# Patient Record
Sex: Male | Born: 1960 | Race: White | Hispanic: No | Marital: Married | Smoking: Never smoker
Health system: Southern US, Community
[De-identification: ages and names within clinical notes are randomized; demographics above are authoritative.]

## PROBLEM LIST (undated history)

## (undated) DIAGNOSIS — E785 Hyperlipidemia, unspecified: Secondary | ICD-10-CM

## (undated) HISTORY — PX: OTHER SURGICAL HISTORY: SHX169

## (undated) HISTORY — DX: Hyperlipidemia, unspecified: E78.5

## (undated) HISTORY — PX: MOUTH SURGERY: SHX715

---

## 2008-02-08 ENCOUNTER — Emergency Department (HOSPITAL_COMMUNITY): Admission: EM | Admit: 2008-02-08 | Discharge: 2008-02-08 | Payer: Self-pay | Admitting: Emergency Medicine

## 2010-06-16 ENCOUNTER — Ambulatory Visit (HOSPITAL_BASED_OUTPATIENT_CLINIC_OR_DEPARTMENT_OTHER): Admission: RE | Admit: 2010-06-16 | Discharge: 2010-06-16 | Payer: Self-pay | Admitting: Orthopedic Surgery

## 2011-08-04 ENCOUNTER — Inpatient Hospital Stay (INDEPENDENT_AMBULATORY_CARE_PROVIDER_SITE_OTHER)
Admission: RE | Admit: 2011-08-04 | Discharge: 2011-08-04 | Disposition: A | Discharge status: Home / Self Care | Payer: 59 | Source: Ambulatory Visit | Attending: Emergency Medicine | Admitting: Emergency Medicine

## 2011-08-04 DIAGNOSIS — K859 Acute pancreatitis without necrosis or infection, unspecified: Secondary | ICD-10-CM

## 2011-08-04 LAB — CBC
HCT: 44.6 % (ref 39.0–52.0)
Hemoglobin: 15.5 g/dL (ref 13.0–17.0)
MCHC: 34.8 g/dL (ref 30.0–36.0)
WBC: 9.2 10*3/uL (ref 4.0–10.5)

## 2011-08-04 LAB — POCT URINALYSIS DIP (DEVICE)
Glucose, UA: NEGATIVE mg/dL
Ketones, ur: 40 mg/dL — AB
Leukocytes, UA: NEGATIVE
Nitrite: NEGATIVE
pH: 5.5 (ref 5.0–8.0)

## 2011-08-04 LAB — COMPREHENSIVE METABOLIC PANEL
AST: 20 U/L (ref 0–37)
Albumin: 4.1 g/dL (ref 3.5–5.2)
Alkaline Phosphatase: 71 U/L (ref 39–117)
BUN: 9 mg/dL (ref 6–23)
CO2: 29 mEq/L (ref 19–32)
Chloride: 101 mEq/L (ref 96–112)
GFR calc non Af Amer: 88 mL/min — ABNORMAL LOW (ref >90)
Potassium: 3.9 mEq/L (ref 3.5–5.1)
Total Bilirubin: 0.5 mg/dL (ref 0.3–1.2)

## 2011-08-06 ENCOUNTER — Other Ambulatory Visit: Payer: Self-pay | Admitting: Family Medicine

## 2011-08-06 DIAGNOSIS — K859 Acute pancreatitis without necrosis or infection, unspecified: Secondary | ICD-10-CM

## 2011-08-07 ENCOUNTER — Ambulatory Visit
Admission: RE | Admit: 2011-08-07 | Discharge: 2011-08-07 | Disposition: A | Discharge status: Home / Self Care | Payer: 59 | Source: Ambulatory Visit | Attending: Family Medicine | Admitting: Family Medicine

## 2011-08-07 DIAGNOSIS — K859 Acute pancreatitis without necrosis or infection, unspecified: Secondary | ICD-10-CM

## 2011-08-15 ENCOUNTER — Encounter (INDEPENDENT_AMBULATORY_CARE_PROVIDER_SITE_OTHER): Payer: Self-pay | Admitting: General Surgery

## 2011-08-30 ENCOUNTER — Encounter (INDEPENDENT_AMBULATORY_CARE_PROVIDER_SITE_OTHER): Payer: Self-pay | Admitting: General Surgery

## 2011-08-30 ENCOUNTER — Ambulatory Visit (INDEPENDENT_AMBULATORY_CARE_PROVIDER_SITE_OTHER): Payer: 59 | Admitting: General Surgery

## 2011-08-30 VITALS — BP 120/74 | HR 68 | Temp 98.1°F | Resp 16 | Ht 74.0 in | Wt 189.6 lb

## 2011-08-30 DIAGNOSIS — K8020 Calculus of gallbladder without cholecystitis without obstruction: Secondary | ICD-10-CM

## 2011-08-30 NOTE — Patient Instructions (Signed)
You have gallstones and probably had an episode of pancreatitis which has now resolved. You will be scheduled for a laparoscopic cholecystectomy with cholangiogram in the near future.  Laparoscopic Cholecystectomy Laparoscopic cholecystectomy is surgery to remove the gallbladder. The gallbladder is located slightly to the right of center in the abdomen, behind the liver. It is a concentrating and storage sac for the bile produced in the liver. Bile aids in the digestion and absorption of fats. Gallbladder disease (cholecystitis) is an inflammation of your gallbladder. This condition is usually caused by a buildup of gallstones (cholelithiasis) in your gallbladder. Gallstones can block the flow of bile, resulting in inflammation and pain. In severe cases, emergency surgery may be required. When emergency surgery is not required, you will have time to prepare for the procedure. Laparoscopic surgery is an alternative to open surgery. Laparoscopic surgery usually has a shorter recovery time. Your common bile duct may also need to be examined and explored. Your caregiver will discuss this with you if he or she feels this should be done. If stones are found in the common bile duct, they may be removed. LET YOUR CAREGIVER KNOW ABOUT:  Allergies to food or medicine.   Medicines taken, including vitamins, herbs, eyedrops, over-the-counter medicines, and creams.   Use of steroids (by mouth or creams).   Previous problems with anesthetics or numbing medicines.   History of bleeding problems or blood clots.   Previous surgery.   Other health problems, including diabetes and kidney problems.   Possibility of pregnancy, if this applies.  RISKS AND COMPLICATIONS All surgery is associated with risks. Some problems that may occur following this procedure include:  Infection.   Damage to the common bile duct, nerves, arteries, veins, or other internal organs such as the stomach or intestines.   Bleeding.     A stone may remain in the common bile duct.  BEFORE THE PROCEDURE  Do not take aspirin for 3 days prior to surgery or blood thinners for 1 week prior to surgery.   Do not eat or drink anything after midnight the night before surgery.   Let your caregiver know if you develop a cold or other infectious problem prior to surgery.   You should be present 60 minutes before the procedure or as directed.  PROCEDURE  You will be given medicine that makes you sleep (general anesthetic). When you are asleep, your surgeon will make several small cuts (incisions) in your abdomen. One of these incisions is used to insert a small, lighted scope (laparoscope) into the abdomen. The laparoscope helps the surgeon see into your abdomen. Carbon dioxide gas will be pumped into your abdomen. The gas allows more room for the surgeon to perform your surgery. Other operating instruments are inserted through the other incisions. Laparoscopic procedures may not be appropriate when:  There is major scarring from previous surgery.   The gallbladder is extremely inflamed.   There are bleeding disorders or unexpected cirrhosis of the liver.   A pregnancy is near term.   Other conditions make the laparoscopic procedure impossible.  If your surgeon feels it is not safe to continue with a laparoscopic procedure, he or she will perform an open abdominal procedure. In this case, the surgeon will make an incision to open the abdomen. This gives the surgeon a larger view and field to work within. This may allow the surgeon to perform procedures that sometimes cannot be performed with a laparoscope alone. Open surgery has a longer recovery time.  AFTER THE PROCEDURE  You will be taken to the recovery area where a nurse will watch and check your progress.   You may be allowed to go home the same day.   Do not resume physical activities until directed by your caregiver.   You may resume a normal diet and activities as  directed.  Document Released: 09/17/2005 Document Revised: 05/30/2011 Document Reviewed: 03/02/2011 [facility] Patient Information 2012 [place], [place].

## 2011-08-30 NOTE — Progress Notes (Signed)
Patient ID: Curtis Jensen, male   DOB: 1960-11-22, 50 y.o.   MRN: [id number]  Chief Complaint  Patient presents with  . Abdominal Pain    gallstones    HPI Curtis Jensen is a 50 y.o. male.  He referred by Dr. Carman Ching for evaluation of gallstones. His primary physician Dr. Manus Gunning.  Approximally one month ago he developed upper abdominal pain. He describes it as bandlike. He became anorexic but did not vomit. After 2 or 3 days the pain became more severe and radiated into his back and he went [facility]. Lab work revealed a normal CBC, normal complete metabolic panel, but his lipase was elevated to 148. He was discharged home on anti-emetics and analgesics and the pain resolved over the next 3 days. He has had no recurrence. No prior episodes. Outpatient ultrasound was performed which shows multiple small gallstones. There is no free fluid. Common bile duct normal. Pancreas and liver are normal.  He is here today to discuss cholecystectomy. HPI  Past Medical History  Diagnosis Date  . Hyperlipidemia     Past Surgical History  Procedure Date  . Right finger surgery     Family History  Problem Relation Age of Onset  . Asthma Mother   . Cancer Father     Social History History  Substance Use Topics  . Smoking status: Never Smoker   . Smokeless tobacco: Not on file  . Alcohol Use: Yes    No Known Allergies  Current Outpatient Prescriptions  Medication Sig Dispense Refill  . simvastatin (ZOCOR) 20 MG tablet Take 20 mg by mouth at bedtime.          Review of Systems Review of Systems  Constitutional: Negative for fever, chills and unexpected weight change.  HENT: Negative for hearing loss, congestion, sore throat, trouble swallowing and voice change.   Eyes: Negative for visual disturbance.  Respiratory: Negative for cough and wheezing.   Cardiovascular: Negative for chest pain, palpitations and leg swelling.  Gastrointestinal: Positive for  nausea and abdominal pain. Negative for vomiting, diarrhea, constipation, blood in stool, abdominal distention, anal bleeding and rectal pain.  Genitourinary: Negative for hematuria and difficulty urinating.  Musculoskeletal: Negative for arthralgias.  Skin: Negative for rash and wound.  Neurological: Negative for seizures, syncope, weakness and headaches.  Hematological: Negative for adenopathy. Does not bruise/bleed easily.  Psychiatric/Behavioral: Negative for confusion.    Blood pressure 120/74, pulse 68, temperature 98.1 F (36.7 C), temperature source Temporal, resp. rate 16, height 6\' 2"  (1.88 m), weight 189 lb 9.6 oz (86.002 kg).  Physical Exam Physical Exam  Constitutional: He is oriented to person, place, and time. He appears well-developed and well-nourished. No distress.  HENT:  Head: Normocephalic.  Nose: Nose normal.  Mouth/Throat: No oropharyngeal exudate.  Eyes: Conjunctivae and EOM are normal. Pupils are equal, round, and reactive to light. Right eye exhibits no discharge. Left eye exhibits no discharge. No scleral icterus.  Neck: Normal range of motion. Neck supple. No JVD present. No tracheal deviation present. No thyromegaly present.  Cardiovascular: Normal rate, regular rhythm, normal heart sounds and intact distal pulses.   No murmur heard. Pulmonary/Chest: Effort normal and breath sounds normal. No stridor. No respiratory distress. He has no wheezes. He has no rales. He exhibits no tenderness.  Abdominal: Soft. Bowel sounds are normal. He exhibits no distension and no mass. There is no tenderness. There is no rebound and no guarding.  Musculoskeletal: Normal range of  motion. He exhibits no edema and no tenderness.  Lymphadenopathy:    He has no cervical adenopathy.  Neurological: He is alert and oriented to person, place, and time. He has normal reflexes. Coordination normal.  Skin: Skin is warm and dry. No rash noted. He is not diaphoretic. No erythema. No pallor.   Psychiatric: He has a normal mood and affect. His behavior is normal. Judgment and thought content normal.    Data Reviewed I have reviewed the ultrasound report, the lab work, and Dr. Randa Evens office notes.  Assessment    Chronic cholecystitis with cholelithiasis.  Recent episode of pancreatitis which presumably was mild and self-limited.  Hyperlipidemia    Plan    We'll schedule for elective laparoscopic cholecystectomy with cholangiogram.  I've discussed the indications and details of the surgery with him. Risks and complications have been outlined, including but not not limited to bleeding, infection, conversion to open laparotomy, bile leak, injury to adjacent organs with major reconstructive surgery, wound healing problems, cardiac pulmonary and thromboembolic problems. He seems to understand these issues well. At this time all of his questions were answered. He is in agreement with this plan.        Lalah Durango M 08/30/2011, 10:43 AM

## 2011-09-11 DIAGNOSIS — K8010 Calculus of gallbladder with chronic cholecystitis without obstruction: Secondary | ICD-10-CM

## 2011-09-11 HISTORY — PX: CHOLECYSTECTOMY: SHX55

## 2011-10-08 ENCOUNTER — Ambulatory Visit (INDEPENDENT_AMBULATORY_CARE_PROVIDER_SITE_OTHER): Payer: 59 | Admitting: General Surgery

## 2011-10-08 ENCOUNTER — Encounter (INDEPENDENT_AMBULATORY_CARE_PROVIDER_SITE_OTHER): Payer: Self-pay | Admitting: General Surgery

## 2011-10-08 VITALS — BP 126/88 | HR 68 | Temp 97.2°F | Resp 12 | Ht 74.0 in | Wt 193.4 lb

## 2011-10-08 DIAGNOSIS — Z9889 Other specified postprocedural states: Secondary | ICD-10-CM

## 2011-10-08 NOTE — Progress Notes (Signed)
Subjective:     Patient ID: Curtis Jensen, male   DOB: May 15, 1961, 51 y.o.   MRN: [id number]  HPI This healthy 51 year old man underwent laparoscopic cholecystectomy with cholangiogram on September 11, 2011. The cholangiogram was normal. The final pathology report shows chronic cholecystitis with cholelithiasis. He has done well. He is back  to a normal diet and normal activities without any complaints.  Review of Systems     Objective:   Physical Exam The patient looks well. He is in no distress.  Abdomen is soft flat nontender. All the trocar sites are well healed.   Assessment:     Chronic cholecystitis with cholelithiasis, uneventful recovery following laparoscopic cholecystectomy with cholangiogram    Plan:     He was given a copy of his pathology report.  Low fat diet advised.  Resume normal activities without restriction  Return to see me p.r.n.     Angelia Mould. Derrell Lolling, M.D., [facility] General and Minimally invasive Surgery Breast and Colorectal Surgery Office:   [phone] Pager:   [phone]

## 2011-10-08 NOTE — Patient Instructions (Signed)
You have recovered without problems following your cholecystectomy. All of your wounds have healed normally. You may resume all normal physical activities, including sports, without restriction. Return to see me if any new problems arise.

## 2016-08-25 ENCOUNTER — Ambulatory Visit (HOSPITAL_COMMUNITY)
Admission: EM | Admit: 2016-08-25 | Discharge: 2016-08-25 | Disposition: A | Discharge status: Home / Self Care | Payer: Commercial Managed Care - HMO | Attending: Emergency Medicine | Admitting: Emergency Medicine

## 2016-08-25 ENCOUNTER — Encounter (HOSPITAL_COMMUNITY): Payer: Self-pay | Admitting: Emergency Medicine

## 2016-08-25 DIAGNOSIS — S61216A Laceration without foreign body of right little finger without damage to nail, initial encounter: Secondary | ICD-10-CM

## 2016-08-25 MED ORDER — LIDOCAINE HCL 2 % IJ SOLN
INTRAMUSCULAR | Status: AC
  Filled 2016-08-25: qty 20

## 2016-08-25 NOTE — ED Triage Notes (Signed)
Pt report lac between the 4th and  5th digit on right hand/web onset 1600  Reports he was unloading dishwasher and cut his finger w/a glass  Bleeding controlled.   Last tetanus w/in 3 years  A&O x4... NAD

## 2016-08-25 NOTE — ED Provider Notes (Signed)
[facility]    CSN: [id number] Arrival date & time: 08/25/16  1636     History   Chief Complaint Chief Complaint  Patient presents with  . Extremity Laceration    HPI Curtis Jensen is a 55 y.o. male.   HPI  He is a 55 year old man here for evaluation of right hand laceration. He was cleaning a glass that had chipped, top and it cut his right little finger. No foreign body. He thinks his last tetanus was 3 years ago. He has full range of motion of his finger.  Past Medical History:  Diagnosis Date  . Hyperlipidemia     There are no active problems to display for this patient.   Past Surgical History:  Procedure Laterality Date  . CHOLECYSTECTOMY  09/11/11  . MOUTH SURGERY    . right finger surgery         Home Medications    Prior to Admission medications   Medication Sig Start Date End Date Taking? Authorizing Provider  simvastatin (ZOCOR) 20 MG tablet Take 20 mg by mouth at bedtime.     Yes Historical Provider, MD    Family History Family History  Problem Relation Age of Onset  . Asthma Mother   . Cancer Father     lung    Social History Social History  Substance Use Topics  . Smoking status: Never Smoker  . Smokeless tobacco: Never Used  . Alcohol use Yes     Allergies   Salmon [fish allergy]   Review of Systems Review of Systems As in history of present illness  Physical Exam Triage Vital Signs ED Triage Vitals  Enc Vitals Group     BP 08/25/16 1705 122/78     Pulse Rate 08/25/16 1705 74     Resp 08/25/16 1705 18     Temp 08/25/16 1705 98.5 F (36.9 C)     Temp Source 08/25/16 1705 Oral     SpO2 08/25/16 1705 100 %     Weight --      Height --      Head Circumference --      Peak Flow --      Pain Score 08/25/16 1706 2     Pain Loc --      Pain Edu? --      Excl. in [place]? --    No data found.   Updated Vital Signs BP 122/78 (BP Location: Left Arm)   Pulse 74   Temp 98.5 F (36.9 C) (Oral)   Resp 18    SpO2 100%   Visual Acuity Right Eye Distance:   Left Eye Distance:   Bilateral Distance:    Right Eye Near:   Left Eye Near:    Bilateral Near:     Physical Exam  Constitutional: He is oriented to person, place, and time. He appears well-developed and well-nourished. No distress.  Cardiovascular: Normal rate.   Pulmonary/Chest: Effort normal.  Neurological: He is alert and oriented to person, place, and time.  Skin:  1.5 cm laceration to the proximal little finger, almost in the web space. Full active range of motion.     UC Treatments / Results  Labs (all labs ordered are listed, but only abnormal results are displayed) Labs Reviewed - No data to display  EKG  EKG Interpretation None       Radiology No results found.  Procedures .Marland KitchenLaceration Repair Date/Time: 08/25/2016 6:22 PM Performed by: Melony Overly Authorized by:  HONIG, ERIN J   Consent:    Consent obtained:  Verbal   Consent given by:  Patient   Alternatives discussed:  No treatment Anesthesia (see MAR for exact dosages):    Anesthesia method:  Local infiltration   Local anesthetic:  Lidocaine 2% w/o epi Laceration details:    Location:  Finger   Finger location:  R small finger   Length (cm):  1.5 Repair type:    Repair type:  Simple Exploration:    Hemostasis achieved with:  Direct pressure   Wound exploration: wound explored through full range of motion     Wound extent: no tendon damage noted     Contaminated: no   Treatment:    Area cleansed with:  Betadine   Amount of cleaning:  Standard Skin repair:    Repair method:  Sutures   Suture size:  5-0   Suture material:  Prolene   Number of sutures:  2 Approximation:    Approximation:  Close Post-procedure details:    Dressing:  Antibiotic ointment and sterile dressing   Patient tolerance of procedure:  Tolerated well, no immediate complications   (including critical care time)  Medications Ordered in UC Medications - No data to  display   Initial Impression / Assessment and Plan / UC Course  I have reviewed the triage vital signs and the nursing notes.  Pertinent labs & imaging results that were available during my care of the patient were reviewed by me and considered in my medical decision making (see chart for details).  Clinical Course     2 sutures placed. Wound care instructions given. Follow-up as needed.  Final Clinical Impressions(s) / UC Diagnoses   Final diagnoses:  Laceration of right little finger without foreign body without damage to nail, initial encounter    New Prescriptions New Prescriptions   No medications on file     Melony Overly, MD 08/25/16 [phone]

## 2016-08-25 NOTE — Discharge Instructions (Signed)
Keep it dry for the next 24 hours. After that you can let warm soapy water run over it twice a day. Do not soak it in water. Come back in one week for suture removal. Call your doctor to make sure you had a tetanus in the last 5 years.

## 2016-10-30 DIAGNOSIS — B349 Viral infection, unspecified: Secondary | ICD-10-CM | POA: Diagnosis not present

## 2016-12-04 DIAGNOSIS — B351 Tinea unguium: Secondary | ICD-10-CM | POA: Diagnosis not present

## 2016-12-04 DIAGNOSIS — L814 Other melanin hyperpigmentation: Secondary | ICD-10-CM | POA: Diagnosis not present

## 2016-12-04 DIAGNOSIS — L821 Other seborrheic keratosis: Secondary | ICD-10-CM | POA: Diagnosis not present

## 2016-12-04 DIAGNOSIS — D1801 Hemangioma of skin and subcutaneous tissue: Secondary | ICD-10-CM | POA: Diagnosis not present

## 2016-12-11 DIAGNOSIS — E7800 Pure hypercholesterolemia, unspecified: Secondary | ICD-10-CM | POA: Diagnosis not present

## 2017-01-11 DIAGNOSIS — E7800 Pure hypercholesterolemia, unspecified: Secondary | ICD-10-CM | POA: Diagnosis not present

## 2017-01-11 DIAGNOSIS — Z79899 Other long term (current) drug therapy: Secondary | ICD-10-CM | POA: Diagnosis not present

## 2017-02-05 DIAGNOSIS — Z79899 Other long term (current) drug therapy: Secondary | ICD-10-CM | POA: Diagnosis not present

## 2017-02-05 DIAGNOSIS — R748 Abnormal levels of other serum enzymes: Secondary | ICD-10-CM | POA: Diagnosis not present

## 2017-02-05 DIAGNOSIS — E7800 Pure hypercholesterolemia, unspecified: Secondary | ICD-10-CM | POA: Diagnosis not present

## 2017-04-16 DIAGNOSIS — E782 Mixed hyperlipidemia: Secondary | ICD-10-CM | POA: Diagnosis not present

## 2017-04-16 DIAGNOSIS — Z79899 Other long term (current) drug therapy: Secondary | ICD-10-CM | POA: Diagnosis not present

## 2017-04-24 ENCOUNTER — Other Ambulatory Visit: Payer: Self-pay | Admitting: Family Medicine

## 2017-04-24 DIAGNOSIS — R748 Abnormal levels of other serum enzymes: Secondary | ICD-10-CM

## 2017-04-30 ENCOUNTER — Ambulatory Visit
Admission: RE | Admit: 2017-04-30 | Discharge: 2017-04-30 | Disposition: A | Discharge status: Home / Self Care | Payer: Commercial Managed Care - HMO | Source: Ambulatory Visit | Attending: Family Medicine | Admitting: Family Medicine

## 2017-04-30 DIAGNOSIS — R748 Abnormal levels of other serum enzymes: Secondary | ICD-10-CM

## 2017-04-30 DIAGNOSIS — R945 Abnormal results of liver function studies: Secondary | ICD-10-CM | POA: Diagnosis not present

## 2017-08-27 DIAGNOSIS — E7800 Pure hypercholesterolemia, unspecified: Secondary | ICD-10-CM | POA: Diagnosis not present

## 2017-12-23 DIAGNOSIS — E782 Mixed hyperlipidemia: Secondary | ICD-10-CM | POA: Diagnosis not present

## 2017-12-23 DIAGNOSIS — Z79899 Other long term (current) drug therapy: Secondary | ICD-10-CM | POA: Diagnosis not present

## 2017-12-23 DIAGNOSIS — G43109 Migraine with aura, not intractable, without status migrainosus: Secondary | ICD-10-CM | POA: Diagnosis not present

## 2018-03-31 DIAGNOSIS — M62838 Other muscle spasm: Secondary | ICD-10-CM | POA: Diagnosis not present

## 2018-03-31 DIAGNOSIS — M7660 Achilles tendinitis, unspecified leg: Secondary | ICD-10-CM | POA: Diagnosis not present

## 2018-04-22 DIAGNOSIS — M542 Cervicalgia: Secondary | ICD-10-CM | POA: Diagnosis not present

## 2018-04-22 DIAGNOSIS — M7662 Achilles tendinitis, left leg: Secondary | ICD-10-CM | POA: Diagnosis not present

## 2018-04-22 DIAGNOSIS — M4722 Other spondylosis with radiculopathy, cervical region: Secondary | ICD-10-CM | POA: Diagnosis not present

## 2018-04-24 DIAGNOSIS — M7662 Achilles tendinitis, left leg: Secondary | ICD-10-CM | POA: Diagnosis not present

## 2018-04-30 DIAGNOSIS — M7662 Achilles tendinitis, left leg: Secondary | ICD-10-CM | POA: Diagnosis not present

## 2018-05-06 DIAGNOSIS — M7662 Achilles tendinitis, left leg: Secondary | ICD-10-CM | POA: Diagnosis not present

## 2018-05-08 DIAGNOSIS — M7662 Achilles tendinitis, left leg: Secondary | ICD-10-CM | POA: Diagnosis not present

## 2018-05-09 DIAGNOSIS — M7662 Achilles tendinitis, left leg: Secondary | ICD-10-CM | POA: Diagnosis not present

## 2018-07-06 ENCOUNTER — Ambulatory Visit (HOSPITAL_COMMUNITY)
Admission: EM | Admit: 2018-07-06 | Discharge: 2018-07-06 | Disposition: A | Discharge status: Home / Self Care | Payer: 59 | Attending: Internal Medicine | Admitting: Internal Medicine

## 2018-07-06 ENCOUNTER — Encounter (HOSPITAL_COMMUNITY): Payer: Self-pay | Admitting: Emergency Medicine

## 2018-07-06 DIAGNOSIS — H66003 Acute suppurative otitis media without spontaneous rupture of ear drum, bilateral: Secondary | ICD-10-CM

## 2018-07-06 MED ORDER — FLUTICASONE PROPIONATE 50 MCG/ACT NA SUSP: 1.0000 | Freq: Every day | NASAL | 0 refills | Status: AC

## 2018-07-06 MED ORDER — CETIRIZINE HCL 10 MG PO CAPS: 10.0000 mg | ORAL_CAPSULE | Freq: Every day | ORAL | 0 refills | Status: AC

## 2018-07-06 MED ORDER — AMOXICILLIN-POT CLAVULANATE 875-125 MG PO TABS: 1.0000 | ORAL_TABLET | Freq: Two times a day (BID) | ORAL | 0 refills | Status: AC

## 2018-07-06 NOTE — ED Triage Notes (Signed)
Pt sts URI sx x 5 days with some ear pain

## 2018-07-06 NOTE — ED Provider Notes (Signed)
[facility]    CSN: [id number] Arrival date & time: 07/06/18  1015     History   Chief Complaint Chief Complaint  Patient presents with  . URI    HPI Curtis Jensen is a 57 y.o. male history of hyperlipidemia presenting today for evaluation of URI symptoms and ear pain.  Patient symptoms began approximately 5 days ago.  Symptoms began with a sore throat, developed into a head cold with significant congestion, occasional coughing.  The symptoms have slightly improved, but he has developed worsening ear pain and feeling as if his ears are clogged.  Has had subjective fevers.  Has not tried any over-the-counter medicines.  Denies history of asthma or smoking.  Denies shortness of breath and chest pain.  HPI  Past Medical History:  Diagnosis Date  . Hyperlipidemia     There are no active problems to display for this patient.   Past Surgical History:  Procedure Laterality Date  . CHOLECYSTECTOMY  09/11/11  . MOUTH SURGERY    . right finger surgery         Home Medications    Prior to Admission medications   Medication Sig Start Date End Date Taking? Authorizing Provider  amoxicillin-clavulanate (AUGMENTIN) 875-125 MG tablet Take 1 tablet by mouth every 12 (twelve) hours for 10 days. 07/06/18 07/16/18  Kayloni Rocco C, PA-C  Cetirizine HCl 10 MG CAPS Take 1 capsule (10 mg total) by mouth daily for 15 days. 07/06/18 07/21/18  Nadean Montanaro C, PA-C  fluticasone (FLONASE) 50 MCG/ACT nasal spray Place 1-2 sprays into both nostrils daily for 7 days. 07/06/18 07/13/18  Rogerick Baldwin C, PA-C  simvastatin (ZOCOR) 20 MG tablet Take 20 mg by mouth at bedtime.      [provider]    Family History Family History  Problem Relation Age of Onset  . Asthma Mother   . Cancer Father        lung    Social History Social History   Tobacco Use  . Smoking status: Never Smoker  . Smokeless tobacco: Never Used  Substance Use Topics  . Alcohol use: Yes  .  Drug use: No     Allergies   Salmon [fish allergy]   Review of Systems Review of Systems  Constitutional: Negative for activity change, appetite change, chills, fatigue and fever.  HENT: Positive for congestion, ear pain, rhinorrhea and sore throat. Negative for sinus pressure and trouble swallowing.   Eyes: Negative for discharge and redness.  Respiratory: Positive for cough. Negative for chest tightness and shortness of breath.   Cardiovascular: Negative for chest pain.  Gastrointestinal: Negative for abdominal pain, diarrhea, nausea and vomiting.  Musculoskeletal: Negative for myalgias.  Skin: Negative for rash.  Neurological: Negative for dizziness, light-headedness and headaches.     Physical Exam Triage Vital Signs ED Triage Vitals  Enc Vitals Group     BP 07/06/18 1048 (!) 153/82     Pulse Rate 07/06/18 1048 74     Resp 07/06/18 1048 16     Temp 07/06/18 1048 98.8 F (37.1 C)     Temp Source 07/06/18 1048 Oral     SpO2 07/06/18 1048 100 %     Weight --      Height --      Head Circumference --      Peak Flow --      Pain Score 07/06/18 1051 4     Pain Loc --      Pain  Edu? --      Excl. in [place]? --    No data found.  Updated Vital Signs BP (!) 153/82 (BP Location: Right Arm)   Pulse 74   Temp 98.8 F (37.1 C) (Oral)   Resp 16   SpO2 100%   Visual Acuity Right Eye Distance:   Left Eye Distance:   Bilateral Distance:    Right Eye Near:   Left Eye Near:    Bilateral Near:     Physical Exam  Constitutional: He appears well-developed and well-nourished.  HENT:  Head: Normocephalic and atraumatic.  Bilateral TMs erythematous, dull and bulging, EACs clear.  Oral mucosa pink and moist, no tonsillar enlargement or exudate. Posterior pharynx patent and nonerythematous, no uvula deviation or swelling. Normal phonation.  Eyes: Conjunctivae are normal.  Neck: Neck supple.  Cardiovascular: Normal rate and regular rhythm.  No murmur  heard. Pulmonary/Chest: Effort normal and breath sounds normal. No respiratory distress.  Breathing comfortably at rest, CTABL, no wheezing, rales or other adventitious sounds auscultated  Abdominal: Soft. There is no tenderness.  Musculoskeletal: He exhibits no edema.  Neurological: He is alert.  Skin: Skin is warm and dry.  Psychiatric: He has a normal mood and affect.  Nursing note and vitals reviewed.    UC Treatments / Results  Labs (all labs ordered are listed, but only abnormal results are displayed) Labs Reviewed - No data to display  EKG None  Radiology No results found.  Procedures Procedures (including critical care time)  Medications Ordered in UC Medications - No data to display  Initial Impression / Assessment and Plan / UC Course  I have reviewed the triage vital signs and the nursing notes.  Pertinent labs & imaging results that were available during my care of the patient were reviewed by me and considered in my medical decision making (see chart for details).     Bilateral otitis media, will treat with Augmentin.  Also discussed starting daily allergy pill and Flonase for congestion and drainage.Discussed strict return precautions. Patient verbalized understanding and is agreeable with plan.  Final Clinical Impressions(s) / UC Diagnoses   Final diagnoses:  Non-recurrent acute suppurative otitis media of both ears without spontaneous rupture of tympanic membranes     Discharge Instructions     Both ears are infected Please begin augmentin twice daily for 10 days Daily allergy pill- zyrtec, claritin, store brand okay Flonase nasal spray 1-2 sprays in each nostril daily  Follow up if symptoms not improving or worsening, developing fever, difficulty breathing   ED Prescriptions    Medication Sig Dispense Auth. Provider   amoxicillin-clavulanate (AUGMENTIN) 875-125 MG tablet Take 1 tablet by mouth every 12 (twelve) hours for 10 days. 20 tablet  Neale Marzette C, PA-C   Cetirizine HCl 10 MG CAPS Take 1 capsule (10 mg total) by mouth daily for 15 days. 15 capsule Bridgitt Raggio C, PA-C   fluticasone (FLONASE) 50 MCG/ACT nasal spray Place 1-2 sprays into both nostrils daily for 7 days. 1 g Tara Rud, [place] C, PA-C     Controlled Substance Prescriptions [place] Controlled Substance Registry consulted? Not Applicable   Janith Lima, [place] 07/06/18 1121

## 2018-07-06 NOTE — Discharge Instructions (Signed)
Both ears are infected Please begin augmentin twice daily for 10 days Daily allergy pill- zyrtec, claritin, store brand okay Flonase nasal spray 1-2 sprays in each nostril daily  Follow up if symptoms not improving or worsening, developing fever, difficulty breathing

## 2018-07-14 DIAGNOSIS — H6983 Other specified disorders of Eustachian tube, bilateral: Secondary | ICD-10-CM | POA: Diagnosis not present

## 2018-09-12 IMAGING — US US ABDOMEN LIMITED
1 series · 14 of 25 positions shown · non-contrast
Comparison: August 07, 2011

CLINICAL DATA: Elevated liver enzymes

EXAM:
ULTRASOUND ABDOMEN LIMITED RIGHT UPPER QUADRANT

[Series 1: us abdomen limited · 0.22mm/px · 14 of 36 slices shown]
[im 1/36]
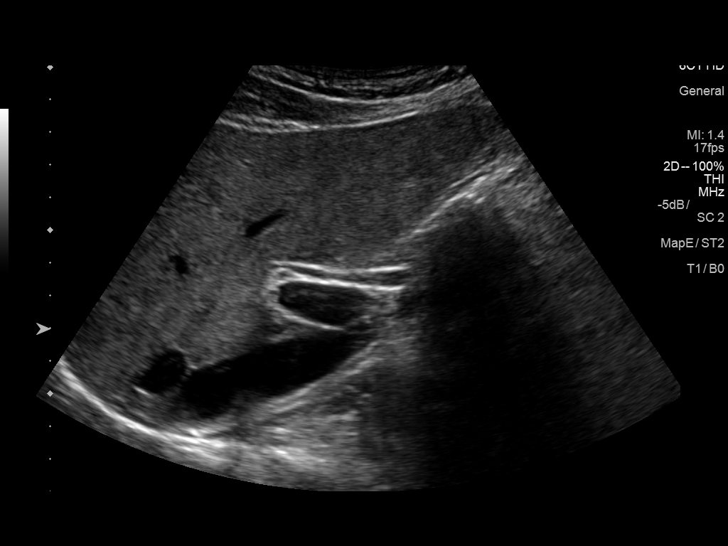
[im 3/36]
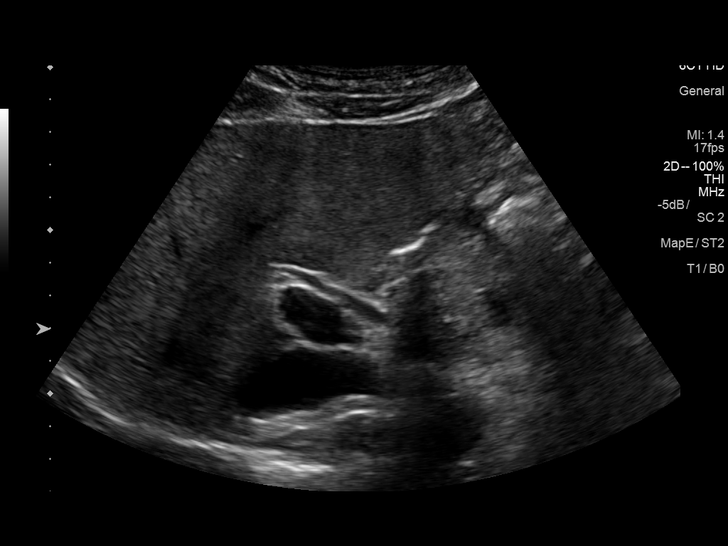
[im 6/36]
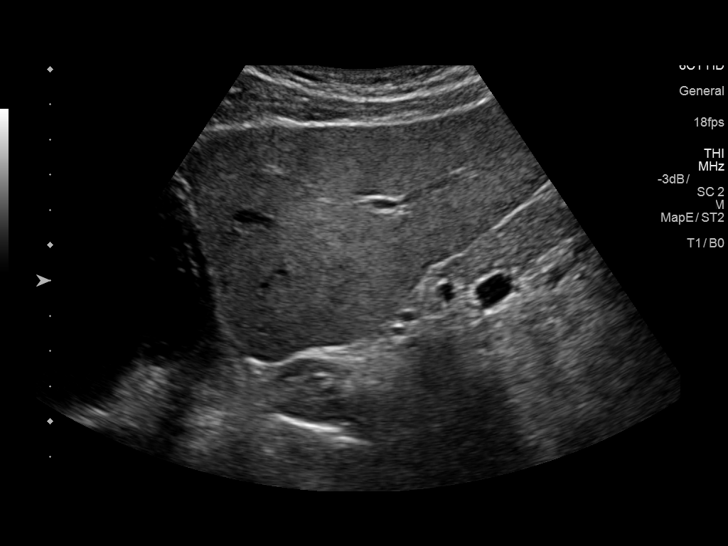
[im 9/36]
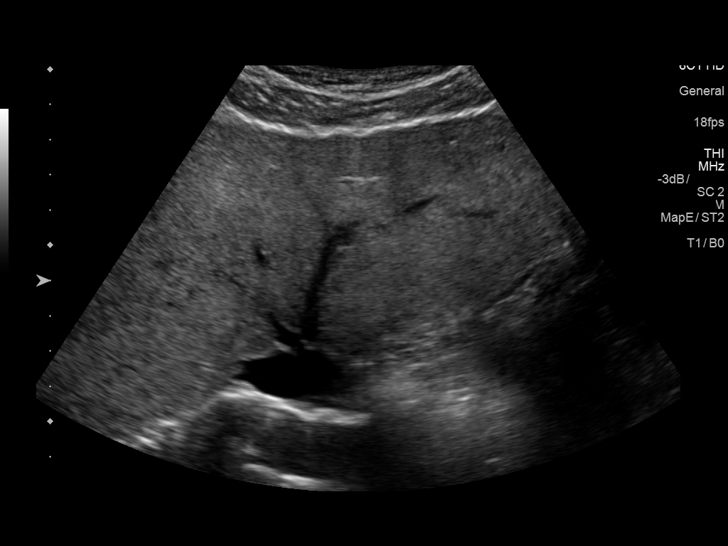
[im 12/36]
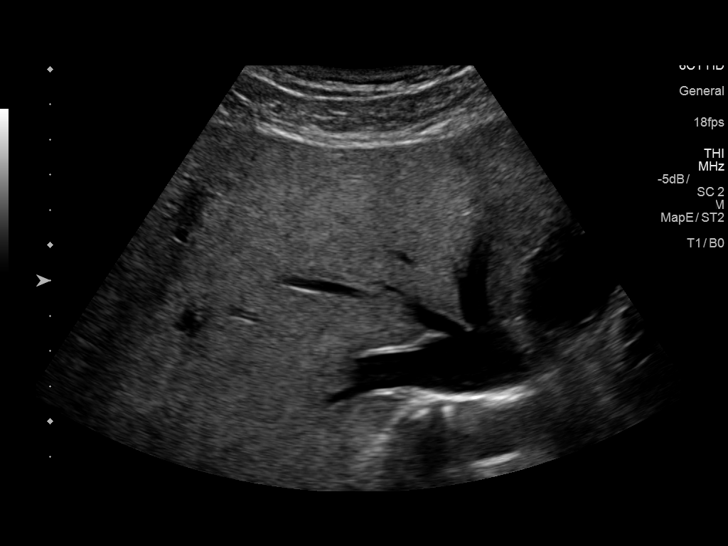
[im 14/36]
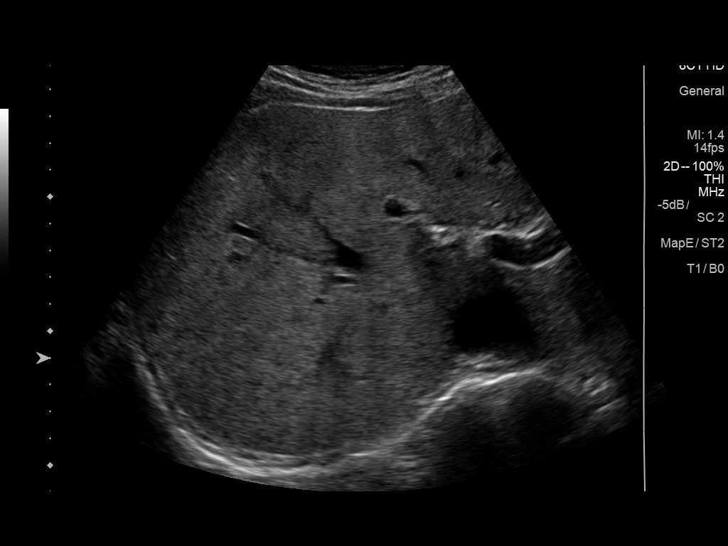
[im 17/36]
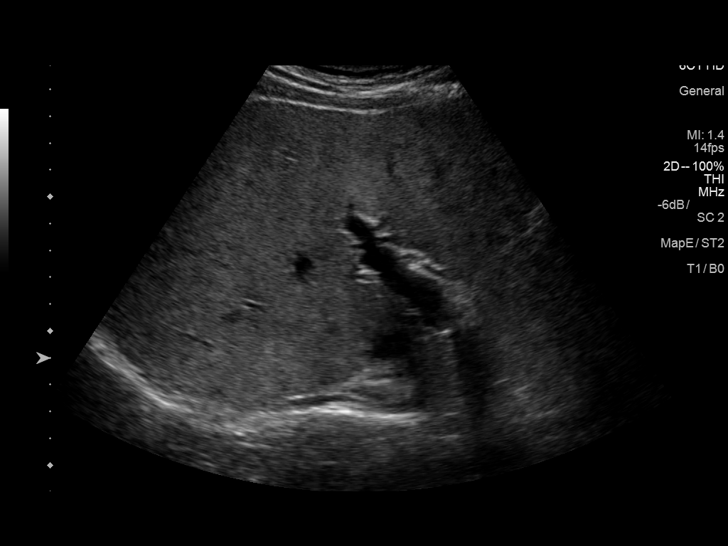
[im 19/36]
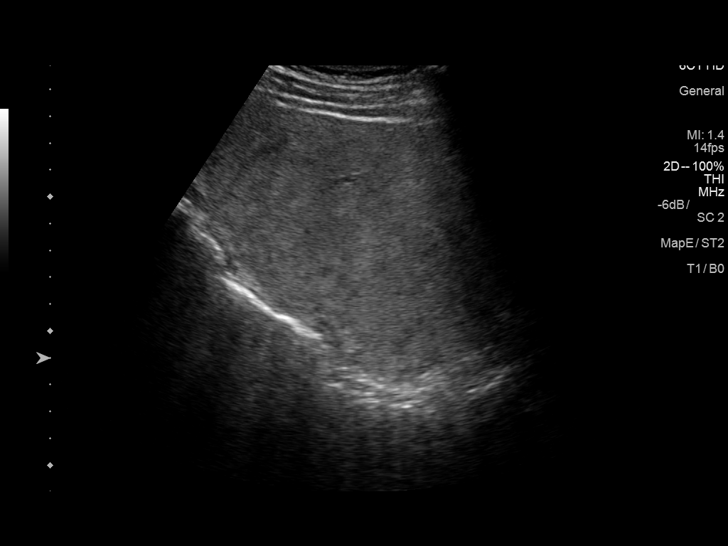
[im 22/36]
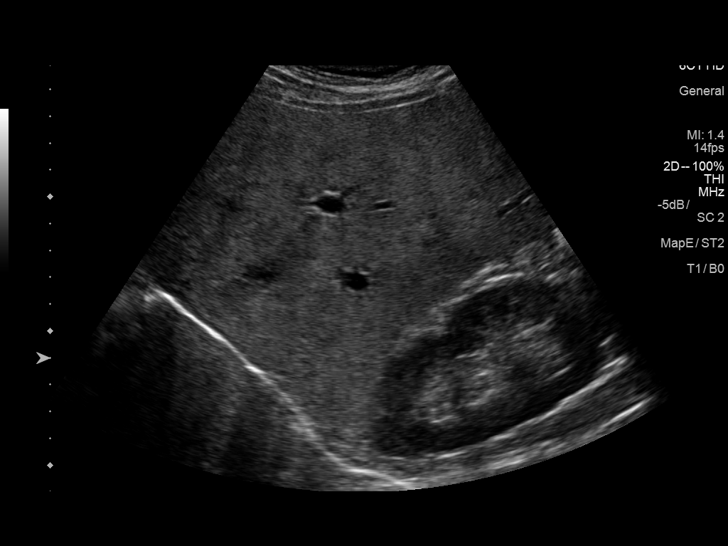
[im 24/36]
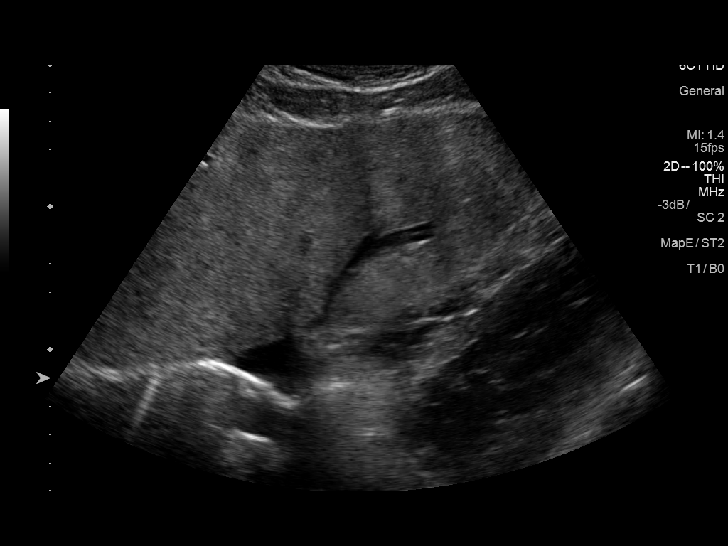
[im 27/36]
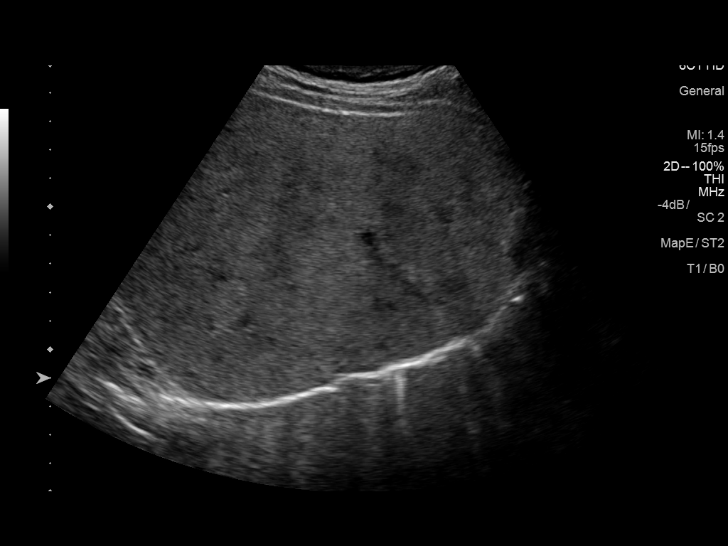
[im 30/36]
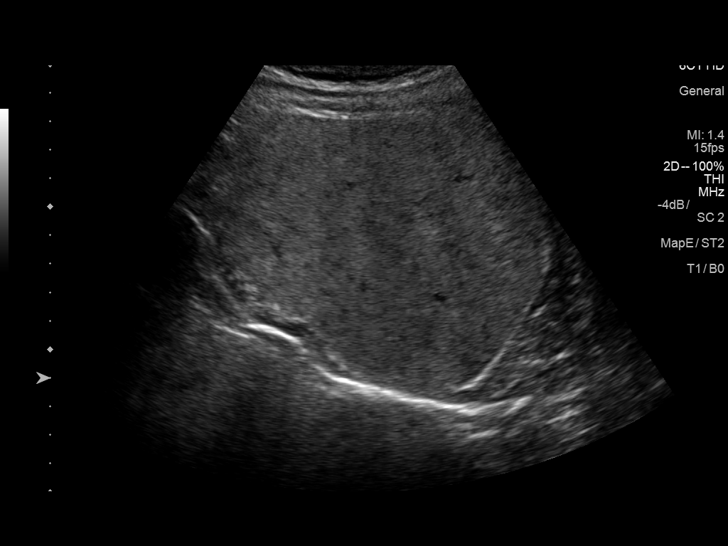
[im 33/36]
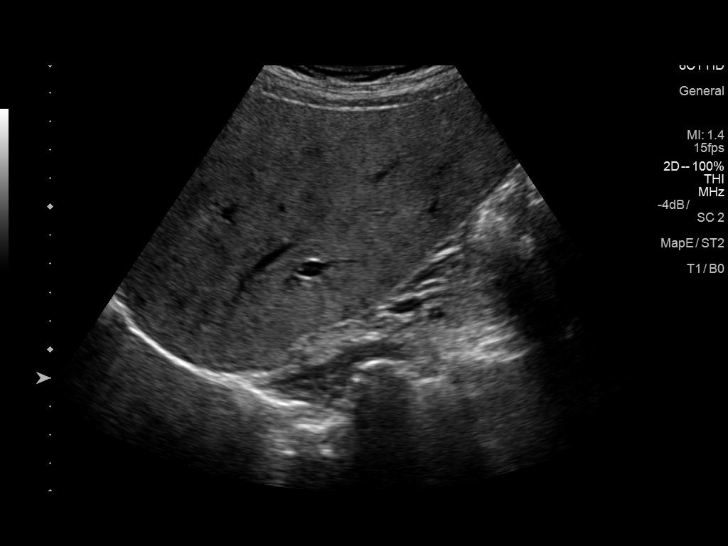
[im 36/36]
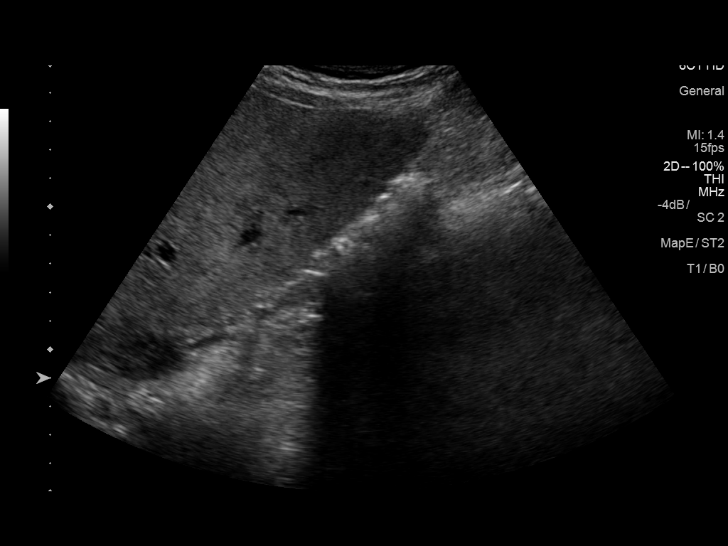

[14 of 25 positions shown; findings below may reference images not displayed]

FINDINGS: Gallbladder:

Surgically absent.

Common bile duct:

Diameter: 4 mm. No intrahepatic or extrahepatic biliary duct
dilatation.

Liver:

No focal lesion identified. Within normal limits in parenchymal
echogenicity. Flow in the portal vein is in the anatomic direction.
IMPRESSION: Gallbladder absent.  Study otherwise unremarkable.

## 2018-10-26 DIAGNOSIS — Z23 Encounter for immunization: Secondary | ICD-10-CM | POA: Diagnosis not present

## 2019-01-07 DIAGNOSIS — G43109 Migraine with aura, not intractable, without status migrainosus: Secondary | ICD-10-CM | POA: Diagnosis not present

## 2019-01-07 DIAGNOSIS — Z79899 Other long term (current) drug therapy: Secondary | ICD-10-CM | POA: Diagnosis not present

## 2019-01-07 DIAGNOSIS — E782 Mixed hyperlipidemia: Secondary | ICD-10-CM | POA: Diagnosis not present

## 2019-02-19 DIAGNOSIS — E782 Mixed hyperlipidemia: Secondary | ICD-10-CM | POA: Diagnosis not present

## 2019-02-19 DIAGNOSIS — Z79899 Other long term (current) drug therapy: Secondary | ICD-10-CM | POA: Diagnosis not present

## 2019-07-27 ENCOUNTER — Other Ambulatory Visit: Payer: Self-pay | Admitting: *Deleted

## 2019-07-27 DIAGNOSIS — Z20822 Contact with and (suspected) exposure to covid-19: Secondary | ICD-10-CM

## 2019-07-28 LAB — NOVEL CORONAVIRUS, NAA: SARS-CoV-2, NAA: NOT DETECTED

## 2019-10-12 ENCOUNTER — Ambulatory Visit: Payer: 59 | Attending: Internal Medicine

## 2019-10-12 DIAGNOSIS — Z20822 Contact with and (suspected) exposure to covid-19: Secondary | ICD-10-CM

## 2019-10-13 LAB — NOVEL CORONAVIRUS, NAA: SARS-CoV-2, NAA: NOT DETECTED

## 2019-10-30 ENCOUNTER — Ambulatory Visit: Payer: 59 | Attending: Internal Medicine

## 2019-10-30 DIAGNOSIS — Z20822 Contact with and (suspected) exposure to covid-19: Secondary | ICD-10-CM

## 2019-10-31 LAB — NOVEL CORONAVIRUS, NAA: SARS-CoV-2, NAA: NOT DETECTED

## 2019-12-07 ENCOUNTER — Ambulatory Visit: Payer: 59 | Attending: Internal Medicine

## 2019-12-07 DIAGNOSIS — Z23 Encounter for immunization: Secondary | ICD-10-CM

## 2019-12-07 NOTE — Progress Notes (Signed)
   Covid-19 Vaccination Clinic  Name:  JAMARIA HOLYFIELD    MRN: [id number] DOB: 1961/09/28  12/07/2019  Mr. Minks was observed post Covid-19 immunization for 15 minutes without incident. He was provided with Vaccine Information Sheet and instruction to access the V-Safe system.   Mr. Bertot was instructed to call [phone] with any severe reactions post vaccine: Marland Kitchen Difficulty breathing  . Swelling of face and throat  . A fast heartbeat  . A bad rash all over body  . Dizziness and weakness   Immunizations Administered    Name Date Dose VIS Date Route   Pfizer COVID-19 Vaccine 12/07/2019 11:32 AM 0.3 mL 09/11/2019 Intramuscular   Manufacturer: [place]   Lot: [id number]   [place]: [id number]

## 2019-12-18 ENCOUNTER — Ambulatory Visit: Payer: 59 | Attending: Internal Medicine

## 2019-12-18 DIAGNOSIS — Z20822 Contact with and (suspected) exposure to covid-19: Secondary | ICD-10-CM

## 2019-12-19 LAB — NOVEL CORONAVIRUS, NAA: SARS-CoV-2, NAA: DETECTED — AB

## 2020-01-06 ENCOUNTER — Ambulatory Visit: Payer: 59

## 2020-03-21 ENCOUNTER — Other Ambulatory Visit: Payer: 59

## 2020-08-01 ENCOUNTER — Other Ambulatory Visit: Payer: 59

## 2020-08-01 DIAGNOSIS — Z20822 Contact with and (suspected) exposure to covid-19: Secondary | ICD-10-CM

## 2020-08-02 LAB — SARS-COV-2, NAA 2 DAY TAT

## 2020-08-02 LAB — NOVEL CORONAVIRUS, NAA: SARS-CoV-2, NAA: NOT DETECTED

## 2020-10-05 ENCOUNTER — Other Ambulatory Visit: Payer: 59

## 2020-10-05 DIAGNOSIS — Z20822 Contact with and (suspected) exposure to covid-19: Secondary | ICD-10-CM

## 2020-10-06 ENCOUNTER — Other Ambulatory Visit: Payer: 59

## 2020-10-06 DIAGNOSIS — Z20822 Contact with and (suspected) exposure to covid-19: Secondary | ICD-10-CM

## 2020-10-08 LAB — NOVEL CORONAVIRUS, NAA: SARS-CoV-2, NAA: NOT DETECTED

## 2020-10-08 LAB — SARS-COV-2, NAA 2 DAY TAT

## 2020-10-09 LAB — NOVEL CORONAVIRUS, NAA: SARS-CoV-2, NAA: NOT DETECTED
